# Patient Record
Sex: Female | Born: 1990 | Race: Black or African American | Hispanic: No | Marital: Single | Smoking: Current every day smoker
Health system: Southern US, Community
[De-identification: ages and names within clinical notes are randomized; demographics above are authoritative.]

## PROBLEM LIST (undated history)

## (undated) ENCOUNTER — Inpatient Hospital Stay (HOSPITAL_COMMUNITY): Payer: Self-pay

## (undated) DIAGNOSIS — O1490 Unspecified pre-eclampsia, unspecified trimester: Secondary | ICD-10-CM

## (undated) DIAGNOSIS — L309 Dermatitis, unspecified: Secondary | ICD-10-CM

## (undated) DIAGNOSIS — J45909 Unspecified asthma, uncomplicated: Secondary | ICD-10-CM

## (undated) HISTORY — PX: LYMPH NODE BIOPSY: SHX201

## (undated) HISTORY — PX: DILATION AND CURETTAGE OF UTERUS: SHX78

---

## 2012-08-14 ENCOUNTER — Encounter (HOSPITAL_COMMUNITY): Payer: Self-pay | Admitting: Emergency Medicine

## 2012-08-14 ENCOUNTER — Emergency Department (HOSPITAL_COMMUNITY)
Admission: EM | Admit: 2012-08-14 | Discharge: 2012-08-14 | Disposition: A | Discharge status: Home / Self Care | Payer: Private Health Insurance - Indemnity | Attending: Emergency Medicine | Admitting: Emergency Medicine

## 2012-08-14 DIAGNOSIS — G478 Other sleep disorders: Secondary | ICD-10-CM | POA: Insufficient documentation

## 2012-08-14 DIAGNOSIS — Z8742 Personal history of other diseases of the female genital tract: Secondary | ICD-10-CM | POA: Insufficient documentation

## 2012-08-14 DIAGNOSIS — R51 Headache: Secondary | ICD-10-CM | POA: Insufficient documentation

## 2012-08-14 DIAGNOSIS — F17200 Nicotine dependence, unspecified, uncomplicated: Secondary | ICD-10-CM | POA: Insufficient documentation

## 2012-08-14 DIAGNOSIS — L299 Pruritus, unspecified: Secondary | ICD-10-CM | POA: Insufficient documentation

## 2012-08-14 DIAGNOSIS — M2550 Pain in unspecified joint: Secondary | ICD-10-CM | POA: Insufficient documentation

## 2012-08-14 DIAGNOSIS — L309 Dermatitis, unspecified: Secondary | ICD-10-CM | POA: Insufficient documentation

## 2012-08-14 DIAGNOSIS — O1490 Unspecified pre-eclampsia, unspecified trimester: Secondary | ICD-10-CM | POA: Insufficient documentation

## 2012-08-14 DIAGNOSIS — L259 Unspecified contact dermatitis, unspecified cause: Secondary | ICD-10-CM | POA: Insufficient documentation

## 2012-08-14 HISTORY — DX: Dermatitis, unspecified: L30.9

## 2012-08-14 HISTORY — DX: Unspecified pre-eclampsia, unspecified trimester: O14.90

## 2012-08-14 MED ORDER — OXYCODONE-ACETAMINOPHEN 5-325 MG PO TABS
1.0000 | ORAL_TABLET | Freq: Once | ORAL | Status: AC
  Administered 2012-08-14: 1 via ORAL
  Filled 2012-08-14: qty 1

## 2012-08-14 MED ORDER — CYCLOBENZAPRINE HCL 10 MG PO TABS
5.0000 mg | ORAL_TABLET | Freq: Once | ORAL | Status: AC
  Administered 2012-08-14: 5 mg via ORAL
  Filled 2012-08-14: qty 1

## 2012-08-14 MED ORDER — HYDROCODONE-ACETAMINOPHEN 5-325 MG PO TABS: 1.0000 | ORAL_TABLET | Freq: Four times a day (QID) | ORAL | Status: DC | PRN

## 2012-08-14 MED ORDER — PREDNISONE 10 MG PO TABS: 20.0000 mg | ORAL_TABLET | Freq: Every day | ORAL | Status: DC

## 2012-08-14 MED ORDER — DOXYCYCLINE HYCLATE 100 MG PO CAPS: 100.0000 mg | ORAL_CAPSULE | Freq: Two times a day (BID) | ORAL | Status: DC

## 2012-08-14 MED ORDER — DIPHENHYDRAMINE HCL 25 MG PO CAPS
25.0000 mg | ORAL_CAPSULE | Freq: Once | ORAL | Status: AC
  Administered 2012-08-14: 25 mg via ORAL
  Filled 2012-08-14: qty 1

## 2012-08-14 MED ORDER — PREDNISONE 20 MG PO TABS
60.0000 mg | ORAL_TABLET | Freq: Once | ORAL | Status: AC
  Administered 2012-08-14: 60 mg via ORAL
  Filled 2012-08-14: qty 3

## 2012-08-14 MED ORDER — DIPHENHYDRAMINE HCL 25 MG PO CAPS: 25.0000 mg | ORAL_CAPSULE | Freq: Four times a day (QID) | ORAL | Status: AC | PRN

## 2012-08-14 NOTE — ED Provider Notes (Signed)
History     CSN: [id number]  Arrival date & time 08/14/12  0138   First MD Initiated Contact with Patient 08/14/12 0155      Chief Complaint  Patient presents with  . Rash  . Migraine    (Consider location/radiation/quality/duration/timing/severity/associated sxs/prior treatment) HPI  21 year old female with hx of eczema presents c/o worsening of her eczematic rash and migraine headache.  Patient reports she has had eczema all throughout her life. For the past month her eczema has worsening which affecting her joints throughout.  She reports gradual onset of joint pain, itchiness, worsening rash.  Joint worsen when she straightening her legs or fingers and improves when she flexes her knees and arms.  She usually have to tense up to help prevent itch and pain.  However pt unable to sleep for the past 2 days due to itchiness.  She has been tensing up so much that she is developing a throbbing headache, which affects both of her temples.  Has had similar headache like this before.  She has tried OTC medications at home without relief.  Denies fever, vision changes, throat swelling, trouble swallowing, cp, sob, n/v/d, abd pain.  No recent sick contact, no tick exposure.    Past Medical History  Diagnosis Date  . Eczema   . Pre-eclampsia     Past Surgical History  Procedure Date  . Lymph node biopsy     History reviewed. No pertinent family history.  History  Substance Use Topics  . Smoking status: Current Every Day Smoker -- 0.5 packs/day  . Smokeless tobacco: Not on file  . Alcohol Use: Yes    OB History    Grav Para Term Preterm Abortions TAB SAB Ect Mult Living                  Review of Systems  Constitutional: Negative for fever and appetite change.  Musculoskeletal: Positive for joint swelling.  Skin: Positive for rash. Negative for wound.    Allergies  Other  Home Medications   Current Outpatient Rx  Name  Route  Sig  Dispense  Refill  . OVER THE  COUNTER MEDICATION      Various OTC creams for rash           BP 143/80  Pulse 133  Temp 98.5 F (36.9 C) (Oral)  Resp 20  SpO2 98%  Physical Exam  Nursing note and vitals reviewed. Constitutional: She is oriented to person, place, and time. She appears well-developed and well-nourished. No distress.  HENT:  Head: Normocephalic and atraumatic.  Mouth/Throat: Oropharynx is clear and moist.  Eyes: Conjunctivae normal and EOM are normal. Pupils are equal, round, and reactive to light.  Neck: Neck supple.  Cardiovascular: Normal rate and regular rhythm.   Pulmonary/Chest: Effort normal and breath sounds normal. No respiratory distress. She has no wheezes.  Abdominal: Soft. There is no tenderness.  Musculoskeletal: Normal range of motion. She exhibits tenderness (tenderness to all  major joints (wrists, elbows, knees) on palpation but with FROM, and no edema.  ).  Lymphadenopathy:    She has no cervical adenopathy.  Neurological: She is alert and oriented to person, place, and time. She has normal strength. GCS eye subscore is 4. GCS verbal subscore is 5. GCS motor subscore is 6.  Skin: Rash (pt has significant eczematic rash throughout body monst prominent to neck, flexor aspect of arms and legs, throughout back and abdomen, with excoration marks.  No pustular, petechial, or  vesicular lesions) noted.    ED Course  Procedures (including critical care time)  Labs Reviewed - No data to display No results found.   No diagnosis found.  1. Eczema 2. headache  MDM  Pt with significant eczematous rash throughout body which will need further management outpatient.  No red flags rash noted.    Also c/o headache.  Has no nuchal rigidity and no focal neuro deficits.  Pain medication given.  Pt will be prescribed with prednisone, benadryl, doxycycline and referral resources.  Return precaution given.  Doubt septic arthritis, doubt infectious rash such as RMSF or Lyme.  Care discussed  with my attending.   BP 110/45  Pulse 111  Temp 98.5 F (36.9 C) (Oral)  Resp 18  SpO2 96%            Fayrene Helper, PA-C 08/14/12 [phone]

## 2012-08-14 NOTE — ED Notes (Signed)
Pt has been c/o eczema flare-up x 1 month, but it has become unbearable this week.  Pt in great pain.

## 2012-08-14 NOTE — ED Notes (Addendum)
Patient complaining of exacerbation of her eczema.  Patient states that it breaks out seasonally, but she has not had a break out this bad since she was a kid; patient complaining of pain all over her body.  Patient reports that the rash and pain is causing her to have migraines.  Patient states that she is unable to lay down due to severe back pain.

## 2012-08-14 NOTE — ED Notes (Signed)
Pt resting.   States improvement in pruritis since meds given.

## 2012-08-14 NOTE — ED Provider Notes (Signed)
Medical screening examination/treatment/procedure(s) were performed by non-physician practitioner and as supervising physician I was immediately available for consultation/collaboration.  Olivia Mackie, MD 08/14/12 [phone]

## 2012-10-19 ENCOUNTER — Emergency Department (HOSPITAL_COMMUNITY)
Admission: EM | Admit: 2012-10-19 | Discharge: 2012-10-19 | Disposition: A | Discharge status: Home / Self Care | Payer: Private Health Insurance - Indemnity | Attending: Emergency Medicine | Admitting: Emergency Medicine

## 2012-10-19 ENCOUNTER — Encounter (HOSPITAL_COMMUNITY): Payer: Self-pay | Admitting: Emergency Medicine

## 2012-10-19 DIAGNOSIS — Z3202 Encounter for pregnancy test, result negative: Secondary | ICD-10-CM | POA: Insufficient documentation

## 2012-10-19 DIAGNOSIS — N390 Urinary tract infection, site not specified: Secondary | ICD-10-CM | POA: Insufficient documentation

## 2012-10-19 DIAGNOSIS — D2330 Other benign neoplasm of skin of unspecified part of face: Secondary | ICD-10-CM | POA: Insufficient documentation

## 2012-10-19 DIAGNOSIS — R87619 Unspecified abnormal cytological findings in specimens from cervix uteri: Secondary | ICD-10-CM | POA: Insufficient documentation

## 2012-10-19 DIAGNOSIS — L309 Dermatitis, unspecified: Secondary | ICD-10-CM

## 2012-10-19 DIAGNOSIS — N760 Acute vaginitis: Secondary | ICD-10-CM

## 2012-10-19 DIAGNOSIS — D229 Melanocytic nevi, unspecified: Secondary | ICD-10-CM

## 2012-10-19 DIAGNOSIS — Z79899 Other long term (current) drug therapy: Secondary | ICD-10-CM | POA: Insufficient documentation

## 2012-10-19 DIAGNOSIS — F17200 Nicotine dependence, unspecified, uncomplicated: Secondary | ICD-10-CM | POA: Insufficient documentation

## 2012-10-19 DIAGNOSIS — L299 Pruritus, unspecified: Secondary | ICD-10-CM | POA: Insufficient documentation

## 2012-10-19 DIAGNOSIS — L259 Unspecified contact dermatitis, unspecified cause: Secondary | ICD-10-CM | POA: Insufficient documentation

## 2012-10-19 LAB — URINE MICROSCOPIC-ADD ON

## 2012-10-19 LAB — URINALYSIS, ROUTINE W REFLEX MICROSCOPIC
Glucose, UA: NEGATIVE mg/dL
Ketones, ur: NEGATIVE mg/dL
Leukocytes, UA: NEGATIVE
Nitrite: POSITIVE — AB
Protein, ur: NEGATIVE mg/dL

## 2012-10-19 LAB — POCT PREGNANCY, URINE: Preg Test, Ur: NEGATIVE

## 2012-10-19 MED ORDER — CEPHALEXIN 500 MG PO CAPS: 500.0000 mg | ORAL_CAPSULE | Freq: Four times a day (QID) | ORAL | Status: DC

## 2012-10-19 MED ORDER — HYDROCORTISONE 2.5 % EX LOTN: TOPICAL_LOTION | Freq: Two times a day (BID) | CUTANEOUS | Status: DC

## 2012-10-19 MED ORDER — METRONIDAZOLE 500 MG PO TABS: 500.0000 mg | ORAL_TABLET | Freq: Two times a day (BID) | ORAL | Status: DC

## 2012-10-19 MED ORDER — CEPHALEXIN 500 MG PO CAPS: 500.0000 mg | ORAL_CAPSULE | Freq: Two times a day (BID) | ORAL | Status: DC

## 2012-10-19 NOTE — ED Provider Notes (Signed)
History     CSN: [id number]  Arrival date & time 10/19/12  1052   First MD Initiated Contact with Patient 10/19/12 1128      Chief Complaint  Patient presents with  . Vaginal Discharge    (Consider location/radiation/quality/duration/timing/severity/associated sxs/prior treatment) HPI 22 year old female presents emergency department with chief complaint of eczema flare, right inner thigh mole, and vaginal discharge with odor.  Patient states that she has a long-term history of chronic generalized eczema.  She's been treating herself with over-the-counter medications.  Recently her itching and eczema has become worse D2 increased stress.  Patient states that she has been unable to find a dermatologist who accepts her type of insurance.  Patient also has a mole  on the inner right thigh that developed during her last pregnancy.  It has increased in size since that time and recently began changing morphology as well.  She is concerned that she may have developing cancer.  Patient has also noticed vaginal discharge and odor over the last week.  She denies any vaginal is itching or dyspareunia.  She is in a monogamous relationship with one partner.  She does not suspect sexual transmitted infection she denies any pelvic or lower abdominal pain she denies any urinary symptoms.  Patient also states that she had a previous abnormal Pap smear and was supposed to undergo a LEEP procedure.  This is approximately one year ago and she never followed up.  Past Medical History  Diagnosis Date  . Eczema   . Pre-eclampsia     Past Surgical History  Procedure Laterality Date  . Lymph node biopsy      No family history on file.  History  Substance Use Topics  . Smoking status: Current Every Day Smoker -- 0.50 packs/day  . Smokeless tobacco: Not on file  . Alcohol Use: Yes    OB History   Grav Para Term Preterm Abortions TAB SAB Ect Mult Living                  Review of Systems Ten systems  reviewed and are negative for acute change, except as noted in the HPI.   Allergies  Other and Stadol  Home Medications   Current Outpatient Rx  Name  Route  Sig  Dispense  Refill  . diphenhydrAMINE (BENADRYL) 25 mg capsule   Oral   Take 1 capsule (25 mg total) by mouth every 6 (six) hours as needed for itching.   30 capsule   0   . doxycycline (VIBRAMYCIN) 100 MG capsule   Oral   Take 1 capsule (100 mg total) by mouth 2 (two) times daily.   20 capsule   0   . HYDROcodone-acetaminophen (NORCO/VICODIN) 5-325 MG per tablet   Oral   Take 1 tablet by mouth every 6 (six) hours as needed for pain.   15 tablet   0   . OVER THE COUNTER MEDICATION      Various OTC creams for rash         . predniSONE (DELTASONE) 10 MG tablet   Oral   Take 2 tablets (20 mg total) by mouth daily.   15 tablet   0     BP 125/76  Pulse 78  Temp(Src) 98.8 F (37.1 C) (Oral)  SpO2 100%  Physical Exam  Physical Exam  Nursing note and vitals reviewed. Constitutional: She is oriented to person, place, and time. She appears well-developed and well-nourished. No distress.  HENT:  Head: Normocephalic  and atraumatic.  Eyes: Conjunctivae normal and EOM are normal. Pupils are equal, round, and reactive to light. No scleral icterus.  Neck: Normal range of motion.  Cardiovascular: Normal rate, regular rhythm and normal heart sounds.  Exam reveals no gallop and no friction rub.   No murmur heard. Pulmonary/Chest: Effort normal and breath sounds normal. No respiratory distress.  Abdominal: Soft. Bowel sounds are normal. She exhibits no distension and no mass. There is no tenderness. There is no guarding.  Neurological: She is alert and oriented to person, place, and time.  Skin: Diffuse generalized eczematous rash.  There is hyperpigmentation and thickening. Right thigh mole approx 5mm in diameter with well demarcated borders. Pelvic exam: normal external genitalia, vulva, vagina, cervix, uterus and  adnexa, CERVIX: normal appearing cervix without discharge or lesions, exam chaperoned by RN Martie Lee.   ED Course  Procedures (including critical care time)  Labs Reviewed  URINALYSIS, ROUTINE W REFLEX MICROSCOPIC - Abnormal; Notable for the following:    APPearance CLOUDY (*)    Nitrite POSITIVE (*)    All other components within normal limits  URINE MICROSCOPIC-ADD ON - Abnormal; Notable for the following:    Squamous Epithelial / LPF MANY (*)    Bacteria, UA FEW (*)    All other components within normal limits  GC/CHLAMYDIA PROBE AMP  WET PREP, GENITAL  POCT PREGNANCY, URINE   No results found.   1. Eczema   2. BV (bacterial vaginosis)   3. UTI (lower urinary tract infection)   4. Mole of skin       MDM  12:46 PM BP 125/76  Pulse 78  Temp(Src) 98.8 F (37.1 C) (Oral)  SpO2 100% Patient UA appears to be postitve for Urinary tract infection.   1:23 PM Patient with BV. Labs pending. With flagyll and keflex. Referral to [facility]. TCA lotion and derm referral.' Mole of the thigh is not concerning for melanoma. No scaling or crusting consistent with scc, no pearly surface or telngectasia of BCC. Not verrucous appearing. At this time there does not appear to be any evidence of an acute emergency medical condition and the patient appears stable for discharge with appropriate outpatient follow up.Diagnosis was discussed with patient who verbalizes understanding and is agreeable to discharge.       Arthor Captain, PA-C 10/19/12 1906

## 2012-10-19 NOTE — ED Notes (Signed)
Pt c/o of discharge that is thick and white, with foul odor. Pt also eczema and takes otc hydrocortisone and lotion for tx, eczema has been getting progressively worse since December 2013. Pt also has a mole on inner rt thigh that has changed in characteristics and she is concerned.

## 2012-10-19 NOTE — ED Notes (Signed)
States has been tx for excema  Before and now it is worse also has a mole that needs looking at and sge has foul odor  From her vag has a d/c

## 2012-10-20 LAB — GC/CHLAMYDIA PROBE AMP: CT Probe RNA: NEGATIVE

## 2012-11-02 NOTE — ED Provider Notes (Signed)
Medical screening examination/treatment/procedure(s) were performed by non-physician practitioner and as supervising physician I was immediately available for consultation/collaboration.  Marwan T Powers, MD 11/02/12 0707 

## 2013-02-16 ENCOUNTER — Emergency Department (HOSPITAL_COMMUNITY)
Admission: EM | Admit: 2013-02-16 | Discharge: 2013-02-16 | Disposition: A | Discharge status: Home / Self Care | Payer: Private Health Insurance - Indemnity | Attending: Emergency Medicine | Admitting: Emergency Medicine

## 2013-02-16 ENCOUNTER — Encounter (HOSPITAL_COMMUNITY): Payer: Self-pay | Admitting: Emergency Medicine

## 2013-02-16 DIAGNOSIS — R112 Nausea with vomiting, unspecified: Secondary | ICD-10-CM

## 2013-02-16 DIAGNOSIS — R109 Unspecified abdominal pain: Secondary | ICD-10-CM | POA: Insufficient documentation

## 2013-02-16 DIAGNOSIS — F17200 Nicotine dependence, unspecified, uncomplicated: Secondary | ICD-10-CM | POA: Insufficient documentation

## 2013-02-16 DIAGNOSIS — L309 Dermatitis, unspecified: Secondary | ICD-10-CM

## 2013-02-16 DIAGNOSIS — R197 Diarrhea, unspecified: Secondary | ICD-10-CM | POA: Insufficient documentation

## 2013-02-16 DIAGNOSIS — Z3202 Encounter for pregnancy test, result negative: Secondary | ICD-10-CM | POA: Insufficient documentation

## 2013-02-16 DIAGNOSIS — L259 Unspecified contact dermatitis, unspecified cause: Secondary | ICD-10-CM | POA: Insufficient documentation

## 2013-02-16 LAB — CBC WITH DIFFERENTIAL/PLATELET
Basophils Absolute: 0 10*3/uL (ref 0.0–0.1)
Basophils Relative: 0 % (ref 0–1)
HCT: 36.5 % (ref 36.0–46.0)
Lymphocytes Relative: 15 % (ref 12–46)
MCHC: 34 g/dL (ref 30.0–36.0)
Monocytes Absolute: 0.2 10*3/uL (ref 0.1–1.0)
Neutro Abs: 4.3 10*3/uL (ref 1.7–7.7)
Neutrophils Relative %: 76 % (ref 43–77)
Platelets: 166 10*3/uL (ref 150–400)
RDW: 14.8 % (ref 11.5–15.5)
WBC: 5.7 10*3/uL (ref 4.0–10.5)

## 2013-02-16 LAB — URINALYSIS, ROUTINE W REFLEX MICROSCOPIC
Ketones, ur: 15 mg/dL — AB
Leukocytes, UA: NEGATIVE
Nitrite: NEGATIVE
Protein, ur: NEGATIVE mg/dL
pH: 6 (ref 5.0–8.0)

## 2013-02-16 LAB — POCT PREGNANCY, URINE: Preg Test, Ur: NEGATIVE

## 2013-02-16 LAB — BASIC METABOLIC PANEL
CO2: 23 mEq/L (ref 19–32)
Chloride: 103 mEq/L (ref 96–112)
Creatinine, Ser: 0.76 mg/dL (ref 0.50–1.10)
GFR calc Af Amer: >90 mL/min (ref >90)
Sodium: 138 mEq/L (ref 135–145)

## 2013-02-16 MED ORDER — ONDANSETRON HCL 4 MG PO TABS: 4.0000 mg | ORAL_TABLET | Freq: Three times a day (TID) | ORAL | Status: DC | PRN

## 2013-02-16 MED ORDER — CARRINGTON MOISTURE BARRIER EX CREA: TOPICAL_CREAM | CUTANEOUS | Status: DC | PRN

## 2013-02-16 MED ORDER — SODIUM CHLORIDE 0.9 % IV BOLUS (SEPSIS)
1000.0000 mL | Freq: Once | INTRAVENOUS | Status: AC
  Administered 2013-02-16: 1000 mL via INTRAVENOUS

## 2013-02-16 MED ORDER — ONDANSETRON HCL 4 MG/2ML IJ SOLN
4.0000 mg | Freq: Once | INTRAMUSCULAR | Status: AC
  Administered 2013-02-16: 4 mg via INTRAVENOUS
  Filled 2013-02-16: qty 2

## 2013-02-16 NOTE — ED Notes (Signed)
Pt. Stated, I started having vomiting and diarrhea since yesterday and my mid stomach hurts.

## 2013-02-16 NOTE — ED Notes (Signed)
Rob Browning, PA at bedside  

## 2013-02-16 NOTE — ED Provider Notes (Signed)
Medical screening examination/treatment/procedure(s) were performed by non-physician practitioner and as supervising physician I was immediately available for consultation/collaboration. Devoria Albe, MD, Armando Gang   Ward Givens, MD 02/16/13 [phone]

## 2013-02-16 NOTE — ED Notes (Signed)
D/c teaching completed by Nona Dell, RN. Pt ambulatory upon d/c.

## 2013-02-16 NOTE — ED Notes (Signed)
Pt drink 12oz of gingerale prior to leaving, states feeling better.

## 2013-02-16 NOTE — ED Provider Notes (Signed)
History     CSN: [id number]  Arrival date & time 02/16/13  1159   First MD Initiated Contact with Patient 02/16/13 1211      Chief Complaint  Patient presents with  . Emesis  . Diarrhea    (Consider location/radiation/quality/duration/timing/severity/associated sxs/prior treatment) HPI Comments: Patient presents to emergency department with chief complaint of nausea and vomiting. She states that her son is been sick with the same. She states that she first began having symptoms last night. She denies seeing any blood in her vomit or stool. States that nothing has made her symptoms better or worse. She also endorses cramping abdominal pain, which she believes is due to the retching. She denies any fevers. She has not tried anything to alleviate her symptoms.  The history is provided by the patient. No language interpreter was used.    Past Medical History  Diagnosis Date  . Eczema   . Pre-eclampsia     Past Surgical History  Procedure Laterality Date  . Lymph node biopsy      No family history on file.  History  Substance Use Topics  . Smoking status: Current Every Day Smoker -- 0.50 packs/day  . Smokeless tobacco: Not on file  . Alcohol Use: Yes    OB History   Grav Para Term Preterm Abortions TAB SAB Ect Mult Living                  Review of Systems  All other systems reviewed and are negative.    Allergies  Other and Stadol  Home Medications   Current Outpatient Rx  Name  Route  Sig  Dispense  Refill  . diphenhydrAMINE (BENADRYL) 25 mg capsule   Oral   Take 1 capsule (25 mg total) by mouth every 6 (six) hours as needed for itching.   30 capsule   0   . MELATONIN PO   Oral   Take 1 tablet by mouth as needed (muscle relaxant).           BP 121/79  Pulse 100  Temp(Src) 98.5 F (36.9 C) (Oral)  Resp 16  SpO2 98%  LMP 01/20/2013  Physical Exam  Nursing note and vitals reviewed. Constitutional: She is oriented to person, place, and time.  She appears well-developed and well-nourished.  HENT:  Head: Normocephalic and atraumatic.  Eyes: Conjunctivae and EOM are normal. Pupils are equal, round, and reactive to light.  Neck: Normal range of motion. Neck supple.  Cardiovascular: Normal rate and regular rhythm.  Exam reveals no gallop and no friction rub.   No murmur heard. Pulmonary/Chest: Effort normal and breath sounds normal. No respiratory distress. She has no wheezes. She has no rales. She exhibits no tenderness.  Abdominal: Soft. Bowel sounds are normal. She exhibits no distension and no mass. There is no tenderness. There is no rebound and no guarding.  Diffuse crampy abdominal pain, no focal abdominal tenderness  Musculoskeletal: Normal range of motion. She exhibits no edema and no tenderness.  Neurological: She is alert and oriented to person, place, and time.  Skin: Skin is warm and dry.  Psychiatric: She has a normal mood and affect. Her behavior is normal. Judgment and thought content normal.    ED Course  Procedures (including critical care time)  Results for orders placed during the hospital encounter of 02/16/13  CBC WITH DIFFERENTIAL      Result Value Range   WBC 5.7  4.0 - 10.5 K/uL   RBC 4.25  3.87 - 5.11 MIL/uL   Hemoglobin 12.4  12.0 - 15.0 g/dL   HCT 91.4  78.2 - 95.6 %   MCV 85.9  78.0 - 100.0 fL   MCH 29.2  26.0 - 34.0 pg   MCHC 34.0  30.0 - 36.0 g/dL   RDW 21.3  08.6 - 57.8 %   Platelets 166  150 - 400 K/uL   Neutrophils Relative % 76  43 - 77 %   Neutro Abs 4.3  1.7 - 7.7 K/uL   Lymphocytes Relative 15  12 - 46 %   Lymphs Abs 0.9  0.7 - 4.0 K/uL   Monocytes Relative 4  3 - 12 %   Monocytes Absolute 0.2  0.1 - 1.0 K/uL   Eosinophils Relative 5  0 - 5 %   Eosinophils Absolute 0.3  0.0 - 0.7 K/uL   Basophils Relative 0  0 - 1 %   Basophils Absolute 0.0  0.0 - 0.1 K/uL  BASIC METABOLIC PANEL      Result Value Range   Sodium 138  135 - 145 mEq/L   Potassium 3.7  3.5 - 5.1 mEq/L   Chloride  103  96 - 112 mEq/L   CO2 23  19 - 32 mEq/L   Glucose, Bld 74  70 - 99 mg/dL   BUN 13  6 - 23 mg/dL   Creatinine, Ser 4.69  0.50 - 1.10 mg/dL   Calcium 9.0  8.4 - 62.9 mg/dL   GFR calc non Af Amer >90  >90 mL/min   GFR calc Af Amer >90  >90 mL/min  URINALYSIS, ROUTINE W REFLEX MICROSCOPIC      Result Value Range   Color, Urine YELLOW  YELLOW   APPearance CLOUDY (*) CLEAR   Specific Gravity, Urine 1.029  1.005 - 1.030   pH 6.0  5.0 - 8.0   Glucose, UA NEGATIVE  NEGATIVE mg/dL   Hgb urine dipstick NEGATIVE  NEGATIVE   Bilirubin Urine SMALL (*) NEGATIVE   Ketones, ur 15 (*) NEGATIVE mg/dL   Protein, ur NEGATIVE  NEGATIVE mg/dL   Urobilinogen, UA 1.0  0.0 - 1.0 mg/dL   Nitrite NEGATIVE  NEGATIVE   Leukocytes, UA NEGATIVE  NEGATIVE  POCT PREGNANCY, URINE      Result Value Range   Preg Test, Ur NEGATIVE  NEGATIVE   No results found.    1. Nausea vomiting and diarrhea   2. Eczema       MDM  Patient with nausea, vomiting, and diarrhea. Suspect viral gastroenteritis. Patient's son has been sick with the same. Will treat with Zofran, fluids, check basic labs.  2:19 PM Patient is feeling better after zofran and fluids.  Will discharge to home with zofran.  Abdomen is non-acute.  No focal abdominal tenderness.  Tolerating PO fluids.  Will discharge to home with PCP follow-up.  Patient understands and agrees with the plan.        Roxy Horseman, PA-C 02/16/13 1420

## 2013-02-16 NOTE — ED Notes (Signed)
Pt given ginger ale in effort to fluid challenge pt

## 2013-02-19 ENCOUNTER — Encounter (HOSPITAL_COMMUNITY): Payer: Self-pay | Admitting: Emergency Medicine

## 2013-02-19 ENCOUNTER — Emergency Department (INDEPENDENT_AMBULATORY_CARE_PROVIDER_SITE_OTHER)
Admission: EM | Admit: 2013-02-19 | Discharge: 2013-02-19 | Disposition: A | Discharge status: Home / Self Care | Payer: Private Health Insurance - Indemnity | Source: Home / Self Care

## 2013-02-19 DIAGNOSIS — L259 Unspecified contact dermatitis, unspecified cause: Secondary | ICD-10-CM

## 2013-02-19 DIAGNOSIS — L309 Dermatitis, unspecified: Secondary | ICD-10-CM

## 2013-02-19 MED ORDER — TRIAMCINOLONE ACETONIDE 40 MG/ML IJ SUSP
40.0000 mg | Freq: Once | INTRAMUSCULAR | Status: AC
  Administered 2013-02-19: 40 mg via INTRAMUSCULAR

## 2013-02-19 MED ORDER — TRIAMCINOLONE ACETONIDE 40 MG/ML IJ SUSP
INTRAMUSCULAR | Status: AC
  Filled 2013-02-19: qty 5

## 2013-02-19 MED ORDER — TRIAMCINOLONE ACETONIDE 0.1 % EX CREA: TOPICAL_CREAM | Freq: Two times a day (BID) | CUTANEOUS | Status: DC

## 2013-02-19 MED ORDER — METHYLPREDNISOLONE 4 MG PO KIT: PACK | ORAL | Status: DC

## 2013-02-19 NOTE — ED Provider Notes (Signed)
Medical screening examination/treatment/procedure(s) were performed by non-physician practitioner and as supervising physician I was immediately available for consultation/collaboration.  Leslee Home, M.D.   Reuben Likes, MD 02/19/13 2101

## 2013-02-19 NOTE — ED Provider Notes (Signed)
History     CSN: [id number]  Arrival date & time 02/19/13  1742   None     No chief complaint on file.   (Consider location/radiation/quality/duration/timing/severity/associated sxs/prior treatment) HPI Comments: Patient has a history of eczema since childhood. M.D. 0 2013 she developed an eczema flare which has persisted through this year as well. He makes it worse. She has a rough papular rash covering most body surface areas. It is very pruritic. She has been trying OTC medications with minimal to no relief.   Past Medical History  Diagnosis Date  . Eczema   . Pre-eclampsia     Past Surgical History  Procedure Laterality Date  . Lymph node biopsy      No family history on file.  History  Substance Use Topics  . Smoking status: Current Every Day Smoker -- 0.50 packs/day  . Smokeless tobacco: Not on file  . Alcohol Use: Yes    OB History   Grav Para Term Preterm Abortions TAB SAB Ect Mult Living                  Review of Systems  Skin: Positive for rash.       As per history of present illness  All other systems reviewed and are negative.    Allergies  Other and Stadol  Home Medications   Current Outpatient Rx  Name  Route  Sig  Dispense  Refill  . diphenhydrAMINE (BENADRYL) 25 mg capsule   Oral   Take 1 capsule (25 mg total) by mouth every 6 (six) hours as needed for itching.   30 capsule   0   . MELATONIN PO   Oral   Take 1 tablet by mouth as needed (muscle relaxant).         . methylPREDNISolone (MEDROL DOSEPAK) 4 MG tablet      follow package directions. Start 02/20/2013   21 tablet   0   . ondansetron (ZOFRAN) 4 MG tablet   Oral   Take 1 tablet (4 mg total) by mouth every 8 (eight) hours as needed for nausea.   10 tablet   0   . Skin Protectants, Misc. (EUCERIN) cream   Topical   Apply topically as needed for wound care.   397 g   0   . triamcinolone cream (KENALOG) 0.1 %   Topical   Apply topically 2 (two) times daily.  Apply for 2 weeks. May use on face   30 g   0     LMP 01/20/2013  Physical Exam  Nursing note and vitals reviewed. Constitutional: She is oriented to person, place, and time. She appears well-nourished. No distress.  Neck: Normal range of motion. Neck supple.  Cardiovascular: Normal rate.   Pulmonary/Chest: Effort normal. No respiratory distress.  Musculoskeletal: She exhibits no edema and no tenderness.  Neurological: She is alert and oriented to person, place, and time. She exhibits normal muscle tone.  Skin: Skin is warm and dry. Rash noted.  Rash as described above.    ED Course  Procedures (including critical care time)  Labs Reviewed - No data to display No results found.   1. Chronic eczema       MDM  Triamcinolone cream to use as directed. Kenalog 40 mg IM Medrol Dosepak for 7 days. Called the above number to obtain a primary care doctor.        Hayden Rasmussen, NP 02/19/13 1816

## 2013-02-19 NOTE — ED Notes (Signed)
Pt c/o eczema flare up x6 months all over body... Denies: fevers... She is alert and oriented w/no signs of acute distress.

## 2013-03-17 ENCOUNTER — Emergency Department (HOSPITAL_COMMUNITY): Payer: Private Health Insurance - Indemnity

## 2013-03-17 ENCOUNTER — Emergency Department (HOSPITAL_COMMUNITY)
Admission: EM | Admit: 2013-03-17 | Discharge: 2013-03-17 | Disposition: A | Discharge status: Home / Self Care | Payer: Private Health Insurance - Indemnity | Attending: Emergency Medicine | Admitting: Emergency Medicine

## 2013-03-17 ENCOUNTER — Encounter (HOSPITAL_COMMUNITY): Payer: Self-pay | Admitting: Emergency Medicine

## 2013-03-17 DIAGNOSIS — O99330 Smoking (tobacco) complicating pregnancy, unspecified trimester: Secondary | ICD-10-CM | POA: Insufficient documentation

## 2013-03-17 DIAGNOSIS — O210 Mild hyperemesis gravidarum: Secondary | ICD-10-CM | POA: Insufficient documentation

## 2013-03-17 DIAGNOSIS — N949 Unspecified condition associated with female genital organs and menstrual cycle: Secondary | ICD-10-CM | POA: Insufficient documentation

## 2013-03-17 DIAGNOSIS — R109 Unspecified abdominal pain: Secondary | ICD-10-CM | POA: Insufficient documentation

## 2013-03-17 DIAGNOSIS — O039 Complete or unspecified spontaneous abortion without complication: Secondary | ICD-10-CM

## 2013-03-17 DIAGNOSIS — O9989 Other specified diseases and conditions complicating pregnancy, childbirth and the puerperium: Secondary | ICD-10-CM | POA: Insufficient documentation

## 2013-03-17 DIAGNOSIS — R51 Headache: Secondary | ICD-10-CM | POA: Insufficient documentation

## 2013-03-17 DIAGNOSIS — M545 Low back pain, unspecified: Secondary | ICD-10-CM | POA: Insufficient documentation

## 2013-03-17 DIAGNOSIS — O209 Hemorrhage in early pregnancy, unspecified: Secondary | ICD-10-CM | POA: Insufficient documentation

## 2013-03-17 DIAGNOSIS — Z872 Personal history of diseases of the skin and subcutaneous tissue: Secondary | ICD-10-CM | POA: Insufficient documentation

## 2013-03-17 LAB — CBC WITH DIFFERENTIAL/PLATELET
Basophils Relative: 0 % (ref 0–1)
Hemoglobin: 12.4 g/dL (ref 12.0–15.0)
Lymphs Abs: 1.4 10*3/uL (ref 0.7–4.0)
MCHC: 34.3 g/dL (ref 30.0–36.0)
Monocytes Relative: 5 % (ref 3–12)
Neutro Abs: 4.2 10*3/uL (ref 1.7–7.7)
Neutrophils Relative %: 67 % (ref 43–77)
Platelets: 190 10*3/uL (ref 150–400)
RBC: 4.18 MIL/uL (ref 3.87–5.11)

## 2013-03-17 LAB — WET PREP, GENITAL
Clue Cells Wet Prep HPF POC: NONE SEEN
Trich, Wet Prep: NONE SEEN
Yeast Wet Prep HPF POC: NONE SEEN

## 2013-03-17 LAB — ABO/RH: ABO/RH(D): B POS

## 2013-03-17 LAB — BASIC METABOLIC PANEL
Calcium: 9.3 mg/dL (ref 8.4–10.5)
GFR calc Af Amer: >90 mL/min (ref >90)
GFR calc non Af Amer: >90 mL/min (ref >90)
Potassium: 3.9 mEq/L (ref 3.5–5.1)
Sodium: 136 mEq/L (ref 135–145)

## 2013-03-17 MED ORDER — ONDANSETRON HCL 4 MG PO TABS: 4.0000 mg | ORAL_TABLET | Freq: Four times a day (QID) | ORAL | Status: DC

## 2013-03-17 MED ORDER — NAPROXEN 250 MG PO TABS
500.0000 mg | ORAL_TABLET | Freq: Once | ORAL | Status: AC
  Administered 2013-03-17: 500 mg via ORAL
  Filled 2013-03-17: qty 2

## 2013-03-17 MED ORDER — SODIUM CHLORIDE 0.9 % IV BOLUS (SEPSIS)
1000.0000 mL | Freq: Once | INTRAVENOUS | Status: AC
  Administered 2013-03-17: 1000 mL via INTRAVENOUS

## 2013-03-17 MED ORDER — KETOROLAC TROMETHAMINE 30 MG/ML IJ SOLN
30.0000 mg | Freq: Once | INTRAMUSCULAR | Status: AC
  Administered 2013-03-17: 30 mg via INTRAVENOUS
  Filled 2013-03-17: qty 1

## 2013-03-17 NOTE — ED Provider Notes (Signed)
History    CSN: [id number] Arrival date & time 03/17/13  9147  First MD Initiated Contact with Patient 03/17/13 [phone]     Chief Complaint  Patient presents with  . Vaginal Bleeding   (Consider location/radiation/quality/duration/timing/severity/associated sxs/prior Treatment) HPI  Patient is a 22 year old female she [id number] presenting to the ED for vaginal bleeding w/ associated lower abdominal cramping and low back pain that began last evening. She rates her pain 3/10. Patient states she has not noticed any clotting and has gone through approximately 1 menstrual pad every 3-4 hours. Pt states since coming off the Depo shot one year ago her periods have been irregular. She states she took a pregnancy test last evening and the result was positive. Pt also endorses nausea, vomiting, generalized headache.   Past Medical History  Diagnosis Date  . Eczema   . Pre-eclampsia    Past Surgical History  Procedure Laterality Date  . Lymph node biopsy     No family history on file. History  Substance Use Topics  . Smoking status: Current Every Day Smoker -- 0.50 packs/day  . Smokeless tobacco: Not on file  . Alcohol Use: Yes   OB History   Grav Para Term Preterm Abortions TAB SAB Ect Mult Living                 Review of Systems  Constitutional: Negative for fever and chills.  HENT: Negative for neck pain and neck stiffness.   Eyes: Negative.   Respiratory: Negative for shortness of breath.   Cardiovascular: Negative for chest pain.  Gastrointestinal: Positive for nausea and abdominal pain.  Genitourinary: Positive for vaginal bleeding and pelvic pain.  Musculoskeletal: Positive for back pain.  Skin: Negative.   Neurological: Positive for headaches.    Allergies  Other and Stadol  Home Medications   Current Outpatient Rx  Name  Route  Sig  Dispense  Refill  . Aspirin-Salicylamide-Caffeine (BC HEADACHE PO)   Oral   Take 1 each by mouth daily as needed (pain).          . cetirizine (ZYRTEC) 5 MG tablet   Oral   Take 5 mg by mouth daily as needed for allergies.         . diphenhydrAMINE (BENADRYL) 25 mg capsule   Oral   Take 1 capsule (25 mg total) by mouth every 6 (six) hours as needed for itching.   30 capsule   0   . OVER THE COUNTER MEDICATION   Oral   Take 2 tablets by mouth daily. OTC vitamin for hair         . ondansetron (ZOFRAN) 4 MG tablet   Oral   Take 1 tablet (4 mg total) by mouth every 6 (six) hours.   12 tablet   0    BP 103/66  Pulse 88  Temp(Src) 97.8 F (36.6 C) (Oral)  Resp 16  SpO2 98%  LMP 02/07/2013 Physical Exam  Constitutional: She is oriented to person, place, and time. She appears well-developed and well-nourished. No distress.  HENT:  Head: Normocephalic and atraumatic.  Mouth/Throat: Oropharynx is clear and moist.  Eyes: Conjunctivae are normal.  Neck: Neck supple.  Cardiovascular: Normal rate, regular rhythm and normal heart sounds.   Pulmonary/Chest: Effort normal and breath sounds normal.  Abdominal: Soft. She exhibits no distension. There is tenderness. There is no rebound and no guarding.  Musculoskeletal: Normal range of motion. She exhibits no edema.  Neurological: She is alert and oriented to  person, place, and time.  Skin: Skin is warm and dry. She is not diaphoretic.   Exam performed by Francee Piccolo L,  exam chaperoned Date: 03/17/2013 Pelvic exam: normal external genitalia without evidence of trauma. VULVA: normal appearing vulva with no masses, tenderness or lesion. VAGINA: normal appearing vagina with normal color and discharge, no lesions. CERVIX: normal appearing cervix without lesions, cervical motion tenderness absent, cervical os open with bleeding; vaginal discharge - bloody, Wet prep and DNA probe for chlamydia and GC obtained.   ADNEXA: normal adnexa in size, nontender and no masses UTERUS: uterus is normal size, shape, consistency and nontender.    ED Course   Procedures (including critical care time)  Medications  sodium chloride 0.9 % bolus 1,000 mL (0 mLs Intravenous Stopped 03/17/13 1324)  naproxen (NAPROSYN) tablet 500 mg (500 mg Oral Given 03/17/13 1145)  ketorolac (TORADOL) 30 MG/ML injection 30 mg (30 mg Intravenous Given 03/17/13 1323)    Labs Reviewed  WET PREP, GENITAL - Abnormal; Notable for the following:    WBC, Wet Prep HPF POC MODERATE (*)    All other components within normal limits  HCG, QUANTITATIVE, PREGNANCY - Abnormal; Notable for the following:    hCG, Beta Chain, Quant, S 3307 (*)    All other components within normal limits  POCT PREGNANCY, URINE - Abnormal; Notable for the following:    Preg Test, Ur POSITIVE (*)    All other components within normal limits  GC/CHLAMYDIA PROBE AMP  BASIC METABOLIC PANEL  CBC WITH DIFFERENTIAL  RPR  ABO/RH   US Ob Comp Less 14 Wks  03/17/2013   *RADIOLOGY REPORT*  Clinical Data: Vaginal bleeding.  OBSTETRIC <14 WK [place] AND TRANSVAGINAL OB US  Technique:  Both transabdominal and transvaginal ultrasound examinations were performed for complete evaluation of the gestation as well as the maternal uterus, adnexal regions, and pelvic cul-de-sac.  Transvaginal technique was performed to assess early pregnancy.  Comparison:  None.  Intrauterine gestational sac:  Visualized/normal in shape. Yolk sac: Present Embryo: None Cardiac Activity: None Heart Rate: N/A bpm  MSD: 6.9 mm  5 w 3 d CRL:   mm   w   d             [place] EDC: 11/14/2013  Maternal uterus/adnexae: No subchorionic hemorrhage. Normal right ovary. Corpus luteum cyst on the left. Trace free pelvic fluid.  IMPRESSION:  1.  Intrauterine gestational sac and small yolk sac.  No embryo. 2.  No subchorionic hemorrhage. 3.  Normal ovaries except for a small left sided corpus luteum cyst.   Original Report Authenticated By: Rudie Meyer, M.D.   US Ob Transvaginal  03/17/2013   *RADIOLOGY REPORT*  Clinical Data: Vaginal bleeding.  OBSTETRIC <14 WK [place]  AND TRANSVAGINAL OB US  Technique:  Both transabdominal and transvaginal ultrasound examinations were performed for complete evaluation of the gestation as well as the maternal uterus, adnexal regions, and pelvic cul-de-sac.  Transvaginal technique was performed to assess early pregnancy.  Comparison:  None.  Intrauterine gestational sac:  Visualized/normal in shape. Yolk sac: Present Embryo: None Cardiac Activity: None Heart Rate: N/A bpm  MSD: 6.9 mm  5 w 3 d CRL:   mm   w   d             [place] EDC: 11/14/2013  Maternal uterus/adnexae: No subchorionic hemorrhage. Normal right ovary. Corpus luteum cyst on the left. Trace free pelvic fluid.  IMPRESSION:  1.  Intrauterine gestational sac and  small yolk sac.  No embryo. 2.  No subchorionic hemorrhage. 3.  Normal ovaries except for a small left sided corpus luteum cyst.   Original Report Authenticated By: Rudie Meyer, M.D.   1. Vaginal bleeding before [redacted] weeks gestation   2. Miscarriage     MDM  Patient presenting with vaginal bleeding and positive pregnancy test. Pelvic exam reveals cervical os open and actively bleeding. Patient Rh+ need for RhoGAM. Ultrasound reviewed. No evidence of ectopic pregnancy. Labs reviewed. Pain managed in ED. Patient advised to followup with OB/GYN regarding miscarriage. Patient d/w with Dr. Ranae Palms, agrees with plan. Patient is stable at time of discharge    Jeannetta Ellis, PA-C 03/17/13 1603

## 2013-03-17 NOTE — ED Notes (Signed)
Paged and spoke with IV team for IV access. Attempted twice unable.

## 2013-03-17 NOTE — ED Notes (Addendum)
Pt has been pregnant 8 times and has 4 children. Pt reports she got a positive pregnancy test yesterday while she was having vaginal bleeding and cramping. Bleeding and cramping remains today; has hx of miscarriages and "this sort of feels like that". Soaks a pad in 3-4 hours. Reports she feels dizzy.

## 2013-03-18 LAB — GC/CHLAMYDIA PROBE AMP: GC Probe RNA: NEGATIVE

## 2013-03-18 NOTE — ED Provider Notes (Signed)
Medical screening examination/treatment/procedure(s) were performed by non-physician practitioner and as supervising physician I was immediately available for consultation/collaboration.   Mikelle Myrick, MD 03/18/13 0712 

## 2013-03-30 ENCOUNTER — Encounter (HOSPITAL_COMMUNITY): Payer: Self-pay

## 2013-03-30 ENCOUNTER — Inpatient Hospital Stay (HOSPITAL_COMMUNITY): Payer: Managed Care, Other (non HMO)

## 2013-03-30 ENCOUNTER — Inpatient Hospital Stay (HOSPITAL_COMMUNITY)
Admission: AD | Admit: 2013-03-30 | Discharge: 2013-03-30 | Disposition: A | Discharge status: Home / Self Care | Payer: Managed Care, Other (non HMO) | Source: Ambulatory Visit | Attending: Obstetrics and Gynecology | Admitting: Obstetrics and Gynecology

## 2013-03-30 DIAGNOSIS — O209 Hemorrhage in early pregnancy, unspecified: Secondary | ICD-10-CM

## 2013-03-30 HISTORY — DX: Unspecified asthma, uncomplicated: J45.909

## 2013-03-30 LAB — CBC
Hemoglobin: 12.5 g/dL (ref 12.0–15.0)
MCH: 29.1 pg (ref 26.0–34.0)
MCHC: 33.4 g/dL (ref 30.0–36.0)
MCV: 87 fL (ref 78.0–100.0)
RBC: 4.3 MIL/uL (ref 3.87–5.11)

## 2013-03-30 LAB — HCG, QUANTITATIVE, PREGNANCY: hCG, Beta Chain, Quant, S: 2990 m[IU]/mL — ABNORMAL HIGH (ref <5)

## 2013-03-30 MED ORDER — IBUPROFEN 600 MG PO TABS: 600.0000 mg | ORAL_TABLET | Freq: Four times a day (QID) | ORAL | Status: AC | PRN

## 2013-03-30 NOTE — MAU Note (Signed)
Patient is in with c/o heavy vaginal bleeding. She does not have a pad on at this time. New pad given. She states that she is having lower abdominal pain.

## 2013-03-30 NOTE — MAU Note (Signed)
Patient states she has been bleeding with clots since she was seen at [facility] ED on 7-16. Has been having abdominal cramping. States the ER MD told her her cervix was open and that she was having a miscarriage.

## 2013-03-30 NOTE — MAU Provider Note (Signed)
  History     CSN: [id number]  Arrival date and time: 03/30/13 1339   None     Chief Complaint  Patient presents with  . Vaginal Bleeding  . Abdominal Cramping   HPI  Pt is [redacted]w[redacted]d pregnant and presents with bleeding in pregnancy.  Pt was seen on 03/17/2013 at [facility] whti bleeding and cramping in pregnancy.  Pt's HCG was 3307 and ultrasound showed [redacted]w[redacted]d IUGS with YS and sm left CLC and no SCH.  Pt was told her cervix was open and she was having a mscarriage and told she could take Motrin or Ibuprofen for pain. Pt states she had continued to have bleeding since she was seen. She has had bleeding a pad an hour.  Pt has hx of SAB x4 - previous pregnancies.  Pt had 4 term deliveries prior to her SABs.  Pt states she has not had this long of bleeding with her other SABs.   RN note: Patient states she has been bleeding with clots since she was seen at [facility] ED on 7-16. Has been having abdominal cramping. States the ER MD told her her cervix was open and that she was having a miscarriage.   Past Medical History  Diagnosis Date  . Eczema   . Pre-eclampsia     Past Surgical History  Procedure Laterality Date  . Lymph node biopsy      No family history on file.  History  Substance Use Topics  . Smoking status: Current Every Day Smoker -- 0.50 packs/day  . Smokeless tobacco: Not on file  . Alcohol Use: Yes    Allergies:  Allergies  Allergen Reactions  . Other     Pain medication given during labor and delivery; unsure of name.  . Stadol (Butorphanol) Rash    Prescriptions prior to admission  Medication Sig Dispense Refill  . Aspirin-Salicylamide-Caffeine (BC HEADACHE PO) Take 1 each by mouth daily as needed (pain).      . cetirizine (ZYRTEC) 5 MG tablet Take 5 mg by mouth daily as needed for allergies.      . diphenhydrAMINE (BENADRYL) 25 mg capsule Take 1 capsule (25 mg total) by mouth every 6 (six) hours as needed for itching.  30 capsule  0  . ondansetron (ZOFRAN) 4 MG tablet Take 1  tablet (4 mg total) by mouth every 6 (six) hours.  12 tablet  0  . OVER THE COUNTER MEDICATION Take 2 tablets by mouth daily. OTC vitamin for hair        ROS Physical Exam   Blood pressure 121/77, pulse 111, resp. rate 16, height 5' 0.5" (1.537 m), weight 58.06 kg (128 lb), last menstrual period 02/07/2013, SpO2 100.00%.  Physical Exam  MAU Course  Procedures Care turned over to Deeann Cree, CNM    Assessment and Plan    Diana Dodson 03/30/2013, 1:55 PM

## 2013-03-30 NOTE — MAU Provider Note (Signed)
Assumed care from Pamelia Hoit NP at 1710. SUBJECTIVE:  Diana Dodson 22 y.o. [phone] @[redacted]w[redacted]d  With early pregnancy bleeding his quantitative beta hCG has fallen from 3307 on 03/17/2013 to 2990 today, 11 days later.Ultrasound on 03/17/2013 showed 5 week 3 day intrauterine gestational sac with yolk sac and small CLC. She has continued bleeding since the 16th, passing clots and having abdominal cramping. History is significant for 4 prior SABs.Marland Kitchen   GC/CT, WP were neg. B pos  OBJECTIVE: Filed Vitals:   03/30/13 1349  BP: 121/77  Pulse: 111  Resp: 16   Results for orders placed during the hospital encounter of 03/30/13 (from the past 24 hour(s))  CBC     Status: None   Collection Time    03/30/13  1:54 PM      Result Value Range   WBC 9.0  4.0 - 10.5 K/uL   RBC 4.30  3.87 - 5.11 MIL/uL   Hemoglobin 12.5  12.0 - 15.0 g/dL   HCT 45.4  09.8 - 11.9 %   MCV 87.0  78.0 - 100.0 fL   MCH 29.1  26.0 - 34.0 pg   MCHC 33.4  30.0 - 36.0 g/dL   RDW 14.7  82.9 - 56.2 %   Platelets 171  150 - 400 K/uL  HCG, QUANTITATIVE, PREGNANCY     Status: Abnormal   Collection Time    03/30/13  1:54 PM      Result Value Range   hCG, Beta Chain, Quant, S 2990 (*) <5 mIU/mL   RADIOLOGY REPORT*  Clinical Data: Vaginal bleeding, recent prior exam demonstrating  intrauterine gestational sac and yolk sac  TRANSVAGINAL OBSTETRIC US  Technique: Transvaginal ultrasound was performed for complete  evaluation of the gestation as well as the maternal uterus, adnexal  regions, and pelvic cul-de-sac.  Comparison: 03/17/2013  Intrauterine gestational [id number], mildly irregular in shape  Yolk sac: Visualized  Embryo: Visualized  Cardiac Activity: Not visualized  CRL: 3mm 6 w 0 d [place] EDC: 11/23/13  Maternal uterus/adnexae:  The ovaries normal. Trace free fluid.  IMPRESSION:  Interval development of a fetal pole without cardiac activity yet  identified within an intrauterine gestational sac. Findings are   suspicious for, but not conclusive for, failed intrauterine  pregnancy. Follow-up ultrasound is recommended in 7 days for  confirmation. This recommendation follows SRU consensus  guidelines: Diagnostic Criteria for Nonviable Pregnancy Early in  the First Trimester. Malva Limes Med 2013; 952:8413-24.  Original Report Authenticated By: Christiana Pellant, M.D.      ASSESSMENT: 1. Bleeding in early pregnancy   Probable failed IUP PLAN: Home with bleeding precautions D/W Dr. Jolayne Panther who reviewed the serial beta hCGs from different labs and the two ultrasound: advises that she return in 2 days for followup beta hCG. If  rising appropriately we'll do an ultrasound in one week. If dropping we will offer her Cytotec and patient understands this option.   Medication List         diphenhydrAMINE 25 mg capsule  Commonly known as:  BENADRYL  Take 1 capsule (25 mg total) by mouth every 6 (six) hours as needed for itching.     ibuprofen 600 MG tablet  Commonly known as:  ADVIL,MOTRIN  Take 1 tablet (600 mg total) by mouth every 6 (six) hours as needed for pain.     ibuprofen 200 MG tablet  Commonly known as:  ADVIL,MOTRIN  Take 800 mg by mouth every 6 (six) hours as needed for pain.  OVER THE COUNTER MEDICATION  Take 2 tablets by mouth daily. OTC vitamin for hair       Follow-up Information   Follow up with [facility] [place] [facility]ERN[facility] ADMISSIONS. (Repeat blood test)    Contact information:   [address] [id number] [place] [address] [phone]    Danae Orleans, CNM 03/30/2013 6:53 PM

## 2013-04-03 ENCOUNTER — Inpatient Hospital Stay (HOSPITAL_COMMUNITY)
Admission: AD | Admit: 2013-04-03 | Discharge: 2013-04-03 | Disposition: A | Discharge status: Home / Self Care | Payer: Managed Care, Other (non HMO) | Source: Ambulatory Visit | Attending: Obstetrics & Gynecology | Admitting: Obstetrics & Gynecology

## 2013-04-03 DIAGNOSIS — O034 Incomplete spontaneous abortion without complication: Secondary | ICD-10-CM

## 2013-04-03 DIAGNOSIS — O021 Missed abortion: Secondary | ICD-10-CM | POA: Insufficient documentation

## 2013-04-03 MED ORDER — OXYCODONE-ACETAMINOPHEN 5-325 MG PO TABS: 1.0000 | ORAL_TABLET | ORAL | Status: DC

## 2013-04-03 MED ORDER — MISOPROSTOL 200 MCG PO TABS: ORAL_TABLET | ORAL | Status: AC

## 2013-04-03 MED ORDER — HYDROCODONE-ACETAMINOPHEN 5-325 MG PO TABS: 1.0000 | ORAL_TABLET | ORAL | Status: AC | PRN

## 2013-04-03 NOTE — MAU Provider Note (Signed)
Angellynn Kimberlin is a 22 y.o. female [phone] @[redacted]w[redacted]d  LMP here for repeat quant.  SUBJECTIVE: Had EPB and seen at [facility] 03/17/2013 with quantitative beta hCG 3307; next seen here 11 days later on 03/30/2013 with quantitative beta hCG 2990. Ultrasound that day showed interval development of a fetal pole with no cardiac activity and CRL consistent with 6 weeks 0 day gestation. Plan was to return for another quant and if rising appropriately schedule ultrasound in one week, if dropping offer Cytotec.  OBJECTIVE: Filed Vitals:   04/03/13 1602  BP: 133/81  Pulse: 115  Temp: 98 F (36.7 C)  Resp: 18   Results for orders placed during the hospital encounter of 04/03/13 (from the past 24 hour(s))  HCG, QUANTITATIVE, PREGNANCY     Status: Abnormal   Collection Time    04/03/13  4:11 PM      Result Value Range   hCG, Beta Chain, Quant, S 821 (*) <5 mIU/mL   ASSESSMENT: Failed early pregnancy  PLAN: cytotec  Care seen by Sharen Counter CNM at 1700. Danae Orleans, CNM 04/03/2013 5:09 PM  Offered Cytotec, pt planning 2 hour drive out of town today so Cytotec 800 mcg prescribed.  Discussed bleeding risks. Pt will be with family and near medical facilities.  Percocet 5/325 x10 tabs prescribed. Continue ibuprofen 600-800 mg Q6 hours F/U in clinic in 1 week for quant hcg.  Message sent to clinic. Return to MAU as needed  Sharen Counter Certified Nurse-Midwife

## 2013-04-03 NOTE — MAU Note (Signed)
Pt reports bleeding and cramping are worse today.Stated she is having a lot of clots come out and changing her pad frequently.

## 2013-04-05 NOTE — MAU Provider Note (Signed)
Attestation of Attending Supervision of Advanced Practitioner (CNM/NP): Evaluation and management procedures were performed by the Advanced Practitioner under my supervision and collaboration.  I have reviewed the Advanced Practitioner's note and chart, and I agree with the management and plan.  Trinka Keshishyan 04/05/2013 2:21 PM

## 2013-04-05 NOTE — MAU Provider Note (Signed)
Attestation of Attending Supervision of Advanced Practitioner (CNM/NP): Evaluation and management procedures were performed by the Advanced Practitioner under my supervision and collaboration.  I have reviewed the Advanced Practitioner's note and chart, and I agree with the management and plan.  Darah Simkin 04/05/2013 2:20 PM

## 2013-04-12 ENCOUNTER — Other Ambulatory Visit: Payer: Private Health Insurance - Indemnity

## 2014-02-02 ENCOUNTER — Encounter (HOSPITAL_COMMUNITY): Payer: Self-pay | Admitting: *Deleted

## 2014-04-09 IMAGING — US US OB COMP LESS 14 WK
1 series · 14 of 28 positions shown · non-contrast
Comparison: None.

CLINICAL DATA: Vaginal bleeding.

OBSTETRIC <14 WK US AND TRANSVAGINAL OB US
TECHNIQUE: Both transabdominal and transvaginal ultrasound
examinations were performed for complete evaluation of the
gestation as well as the maternal uterus, adnexal regions, and
pelvic cul-de-sac.  Transvaginal technique was performed to assess
early pregnancy.

[Series 1: us ob comp less 14 wk · 0.30mm/px · 14 of 50 slices shown]
[im 2/50]
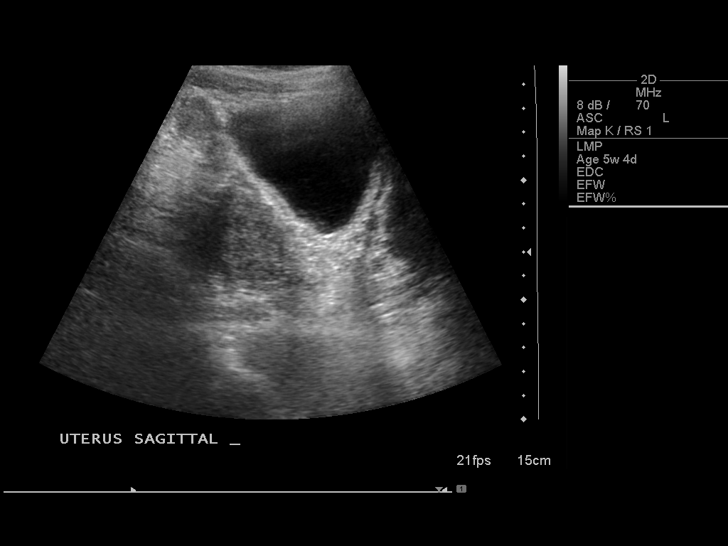
[im 6/50]
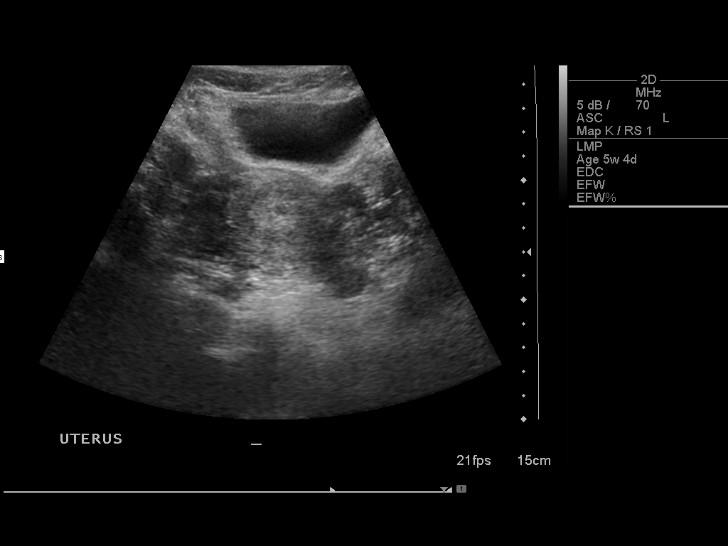
[im 10/50]
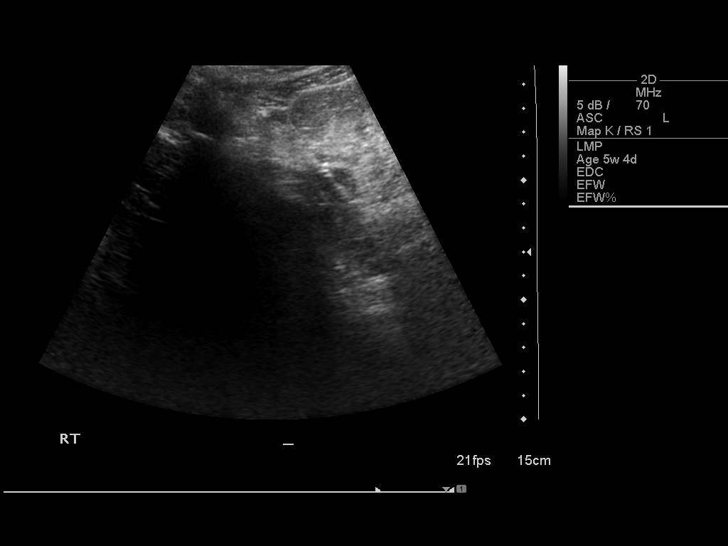
[im 13/50]
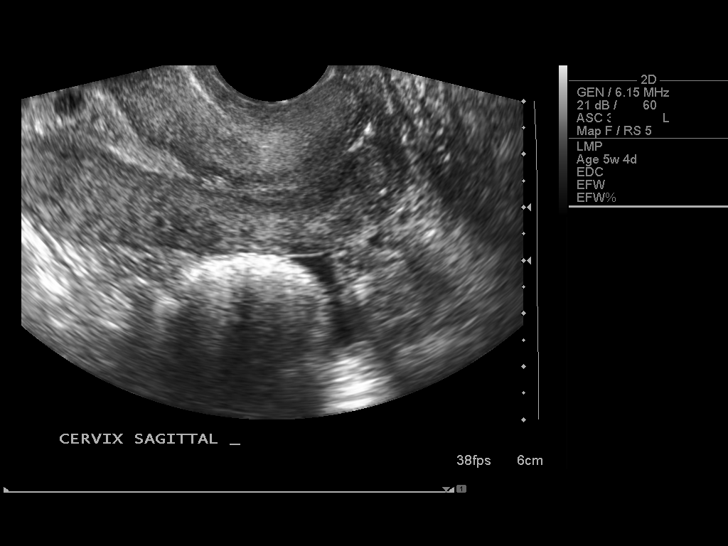
[im 17/50]
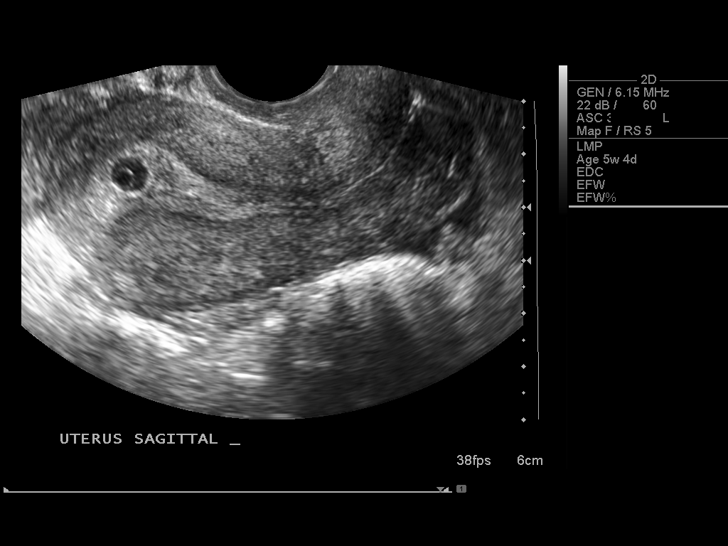
[im 20/50]
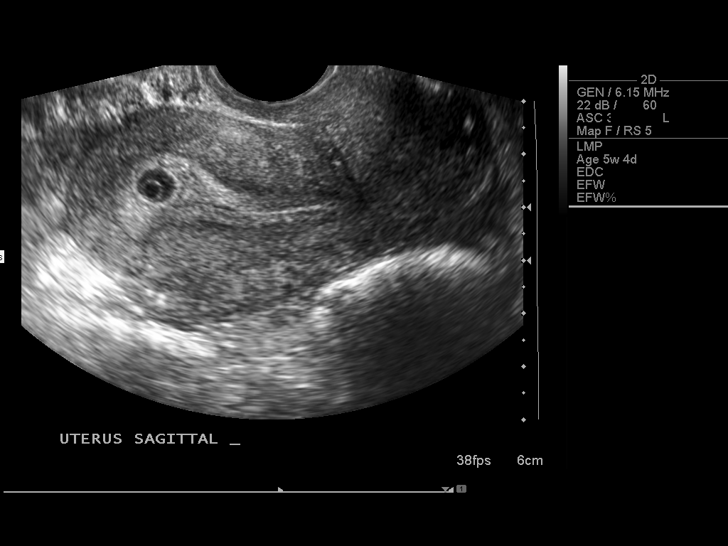
[im 24/50]
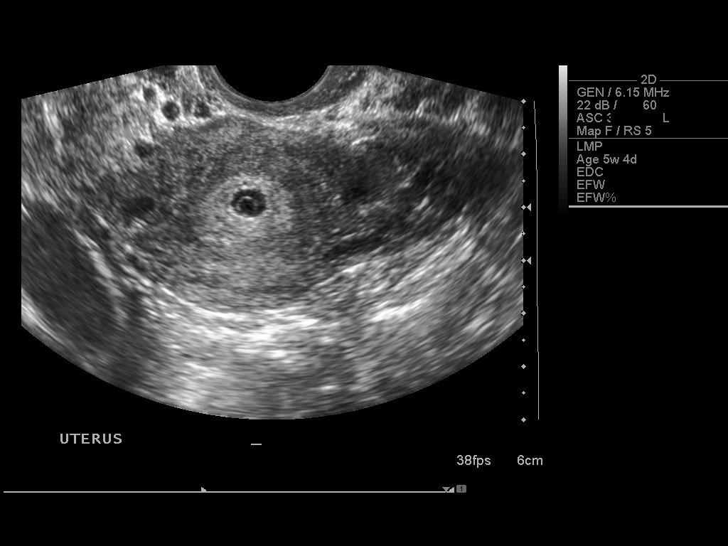
[im 28/50]
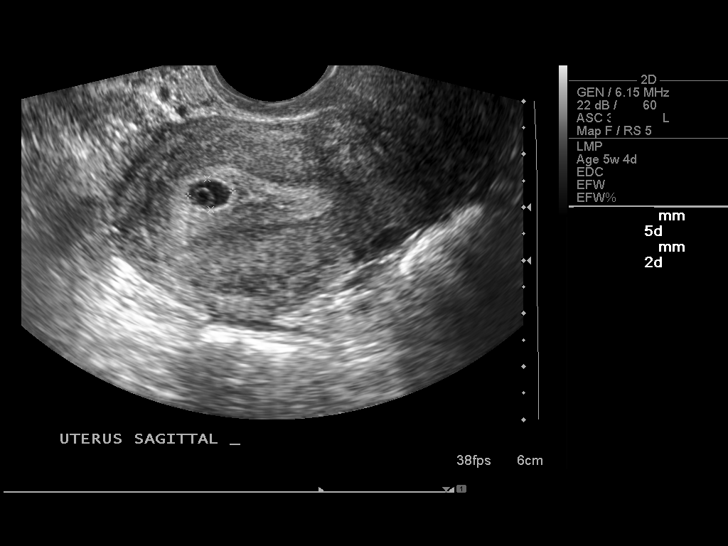
[im 31/50]
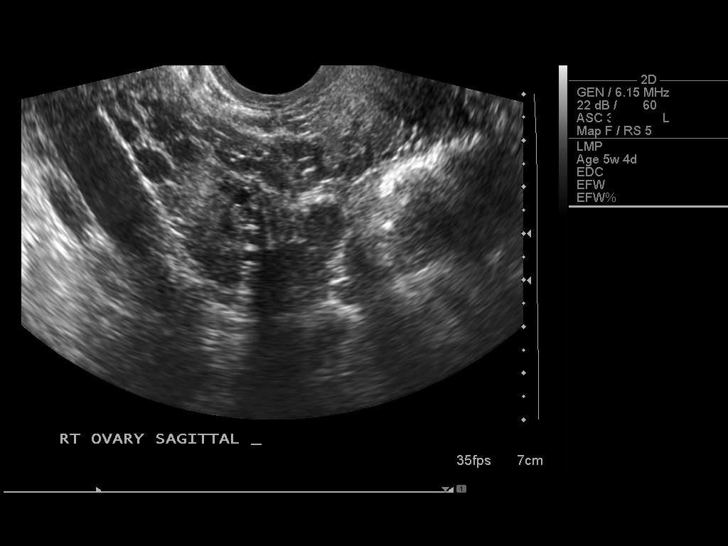
[im 35/50]
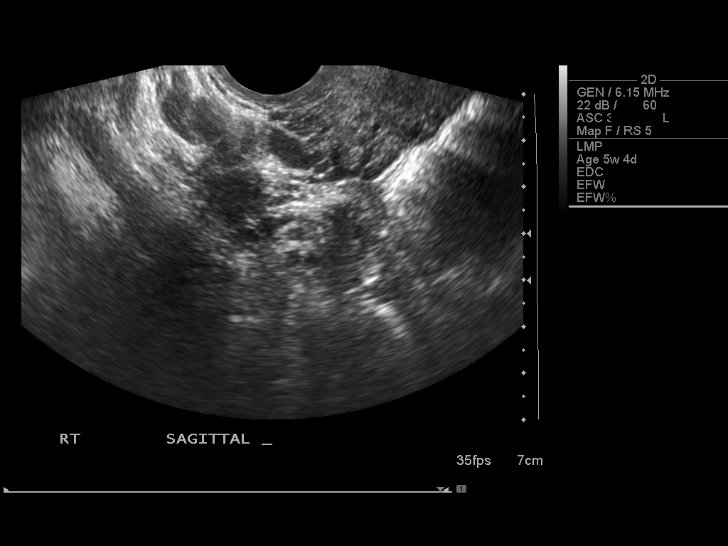
[im 39/50]
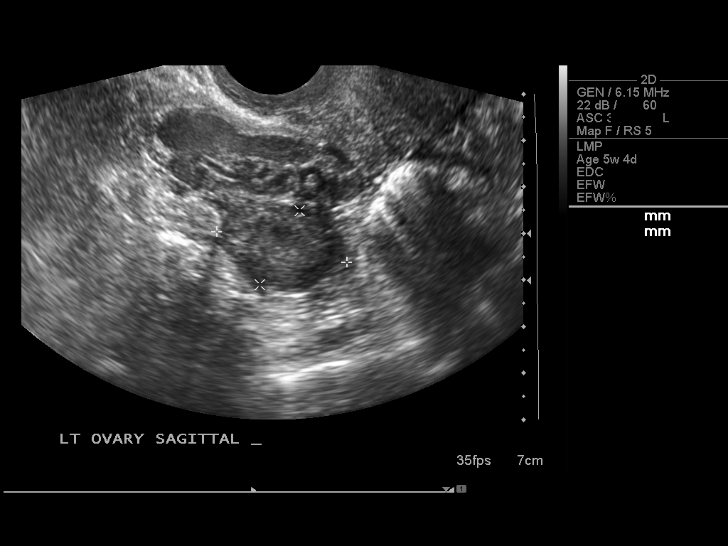
[im 42/50]
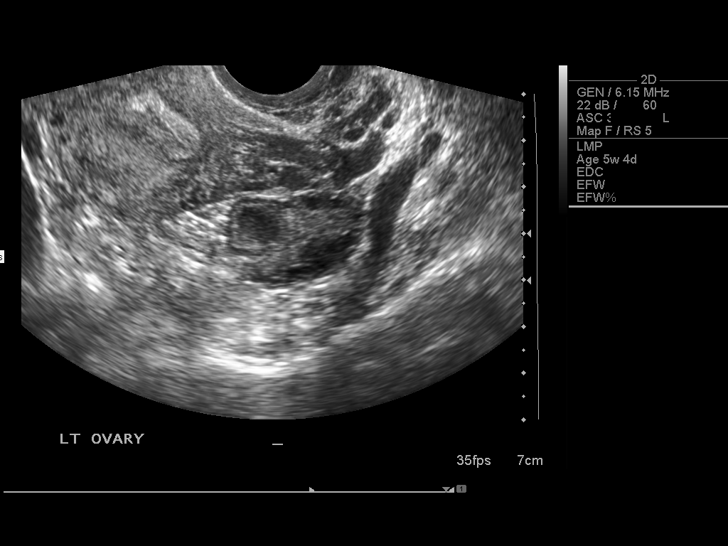
[im 46/50]
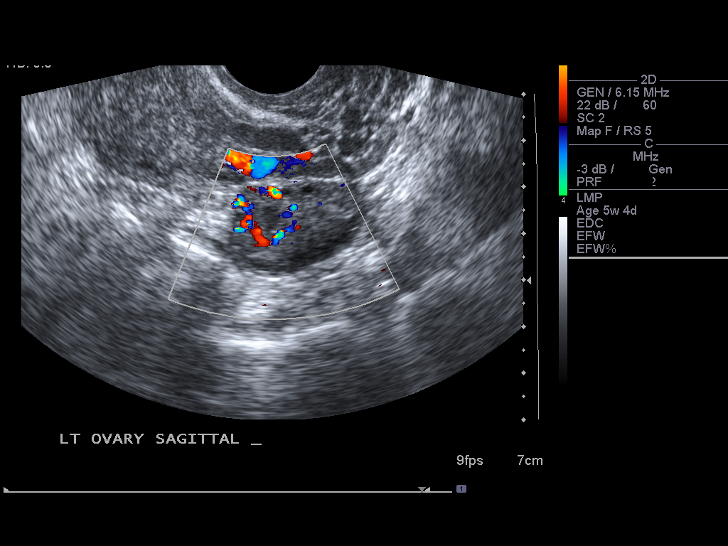
[im 50/50]
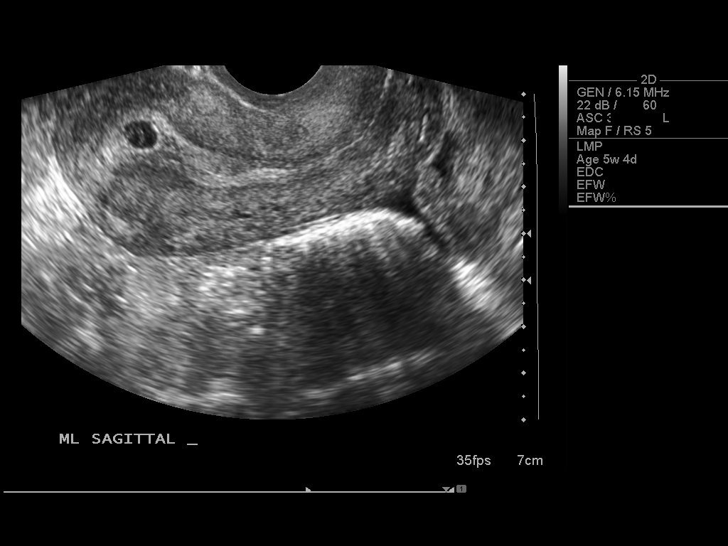

[14 of 28 positions shown; findings below may reference images not displayed]

Intrauterine gestational sac:  Visualized/normal in shape.
Yolk sac: Present
Embryo: None
Cardiac Activity: None
Heart Rate: N/A bpm

MSD: 6.9 mm  5 w 3 d
CRL:   mm   w   d             US EDC: 11/14/2013

Maternal uterus/adnexae:
No subchorionic hemorrhage.
Normal right ovary.
Corpus luteum cyst on the left.
Trace free pelvic fluid.
IMPRESSION: 1.  Intrauterine gestational sac and small yolk sac.  No embryo.
2.  No subchorionic hemorrhage.
3.  Normal ovaries except for a small left sided corpus luteum
cyst.

## 2014-04-22 IMAGING — US US OB TRANSVAGINAL
1 series · 14 of 21 positions shown · non-contrast
Comparison: 03/17/2013

CLINICAL DATA: Vaginal bleeding, recent prior exam demonstrating
intrauterine gestational sac and yolk sac

TRANSVAGINAL OBSTETRIC US
TECHNIQUE: Transvaginal ultrasound was performed for complete
evaluation of the gestation as well as the maternal uterus, adnexal
regions, and pelvic cul-de-sac.

[Series 1: us ob transvaginal · 21 acquisitions, 14 frames shown]
[im 1/21]
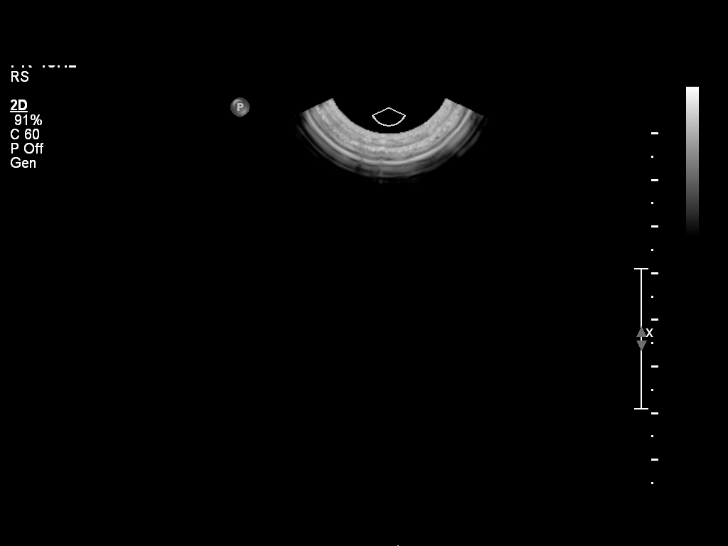
[im 3/21]
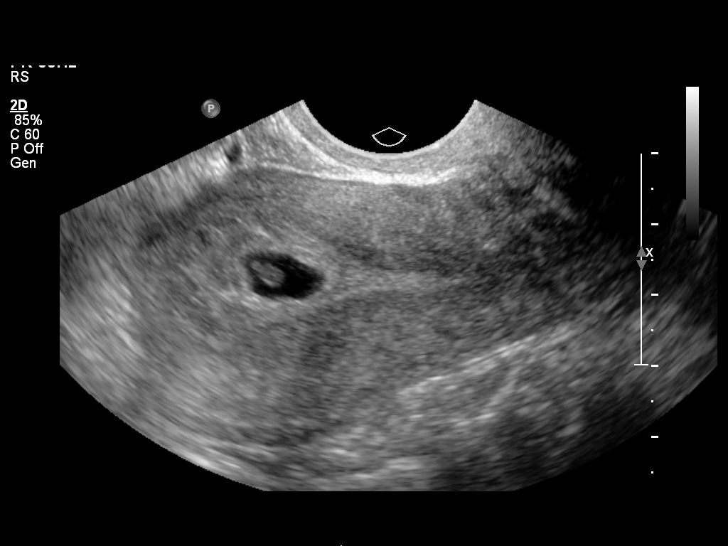
[im 4/21]
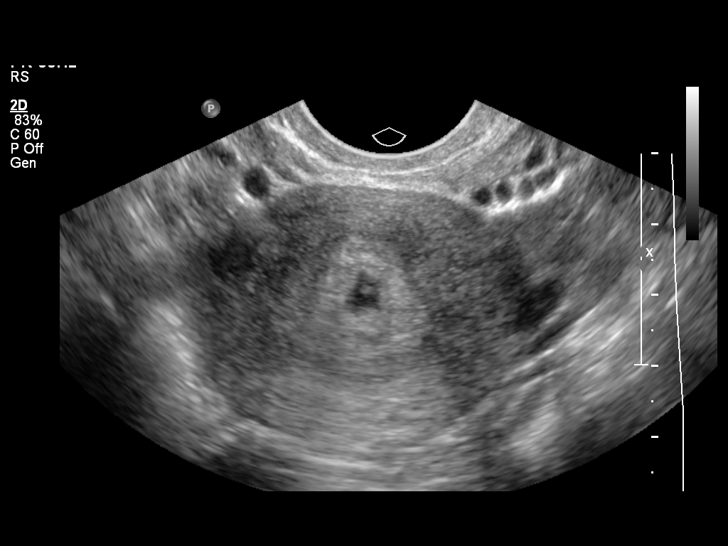
[im 6/21]
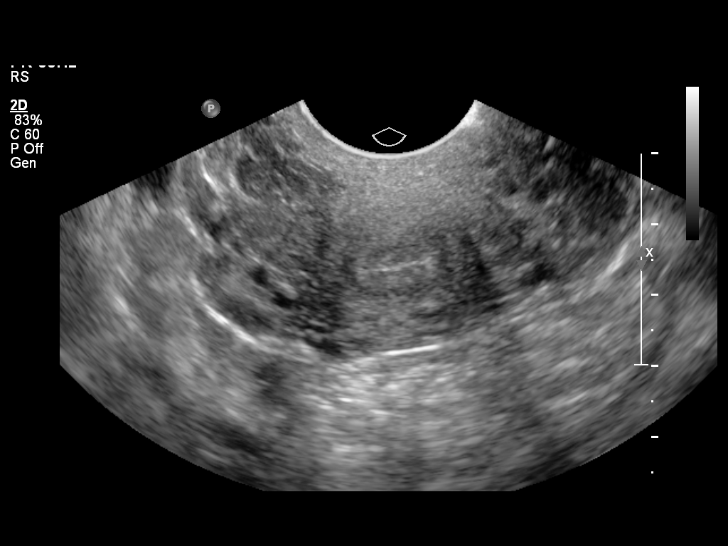
[im 7/21]
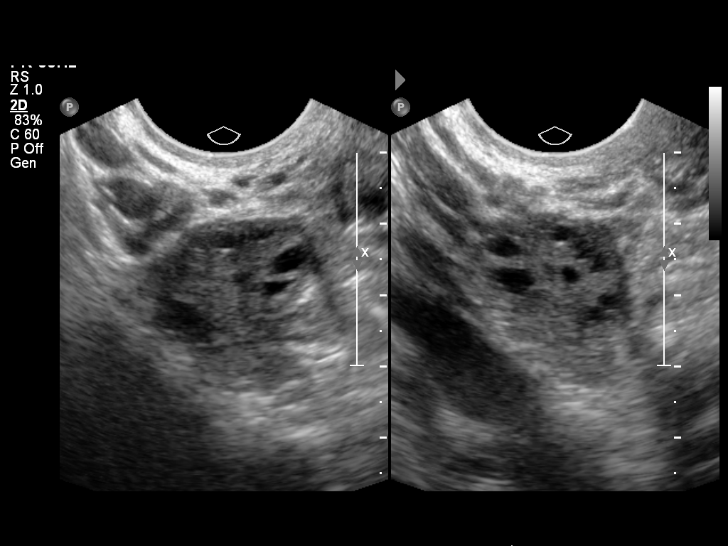
[im 9/21]
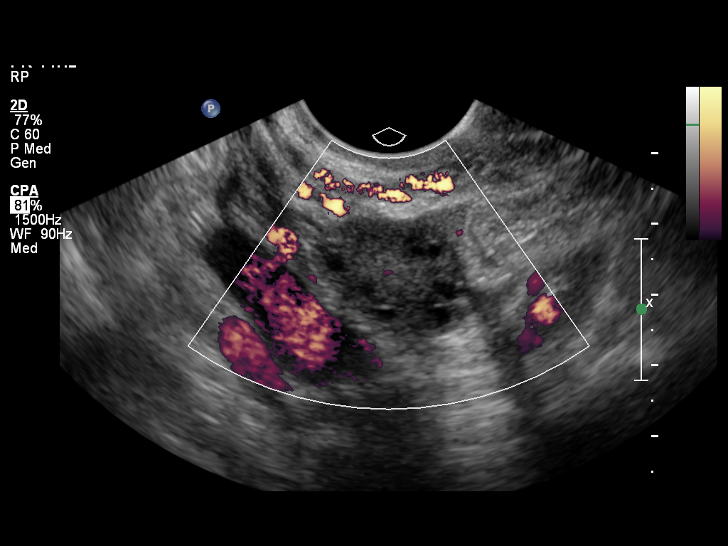
[im 10/21]
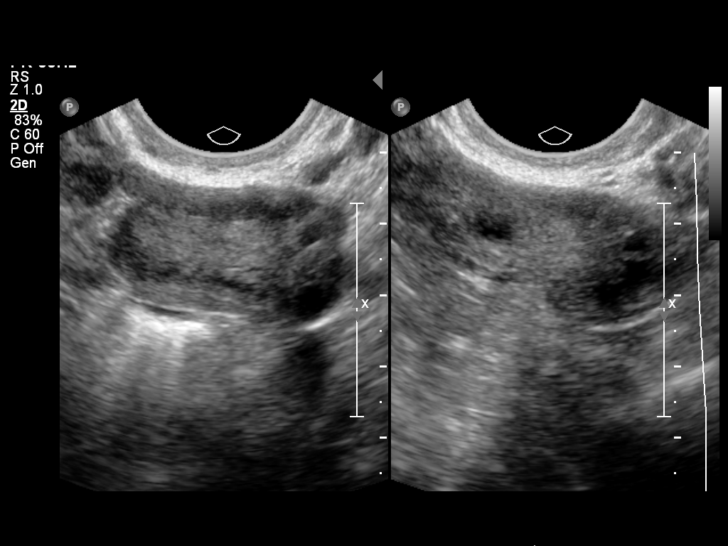
[im 12/21]
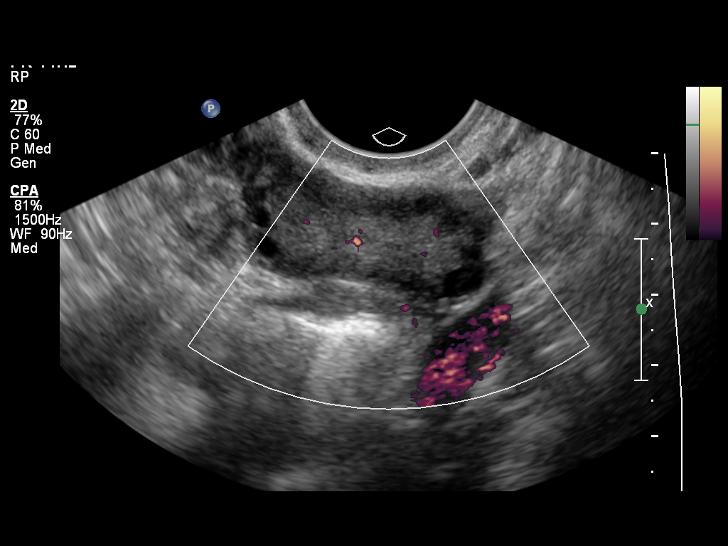
[im 13/21]
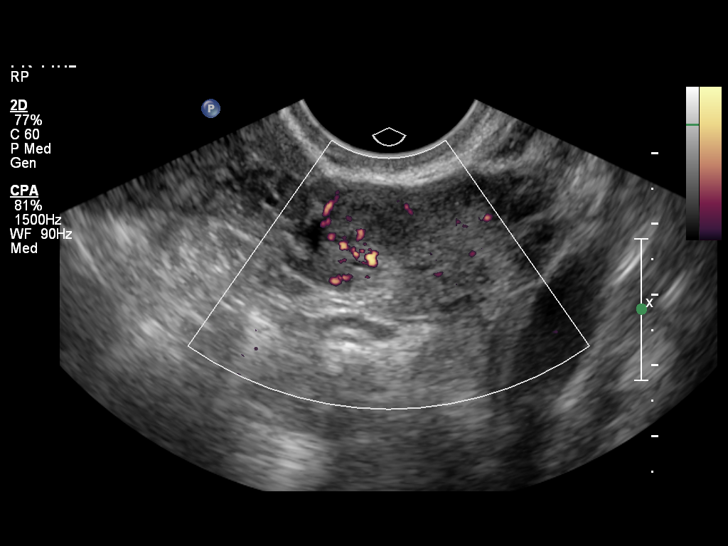
[im 15/21]
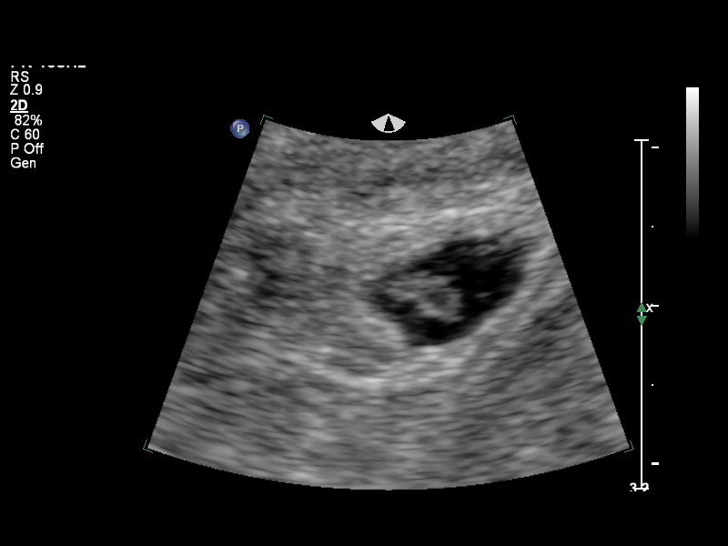
[im 16/21]
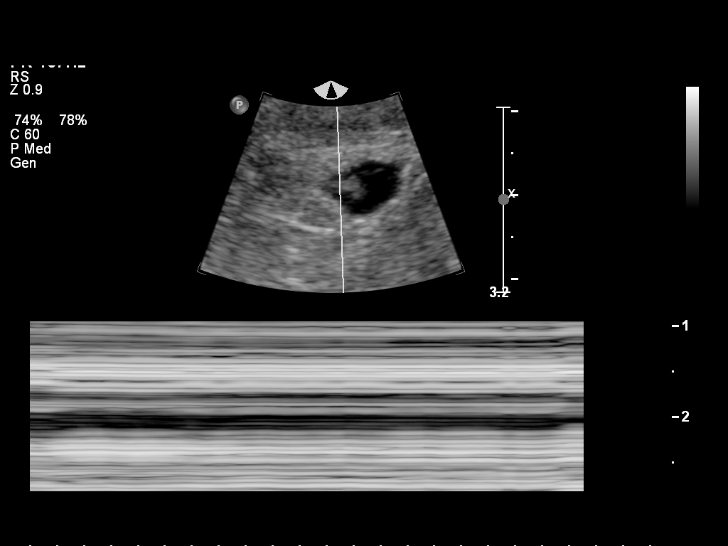
[im 18/21]
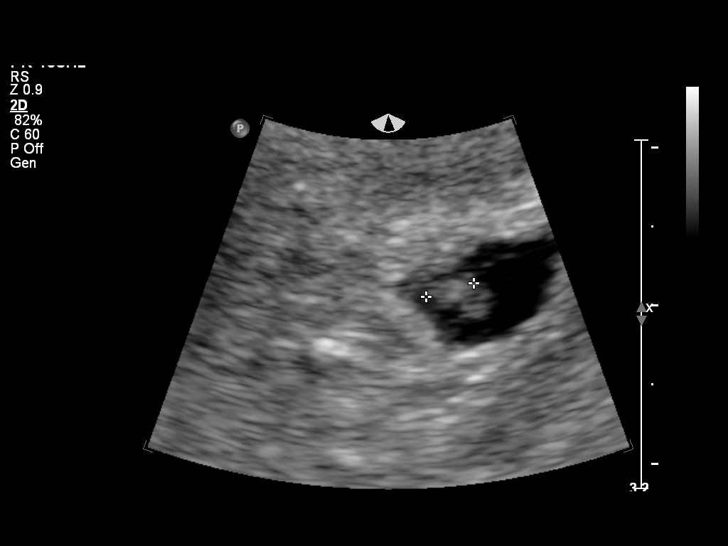
[im 19/21]
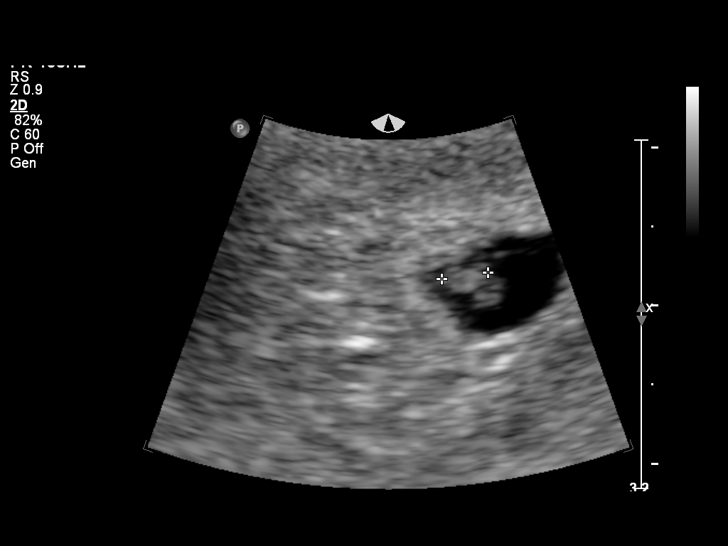
[im 21/21]
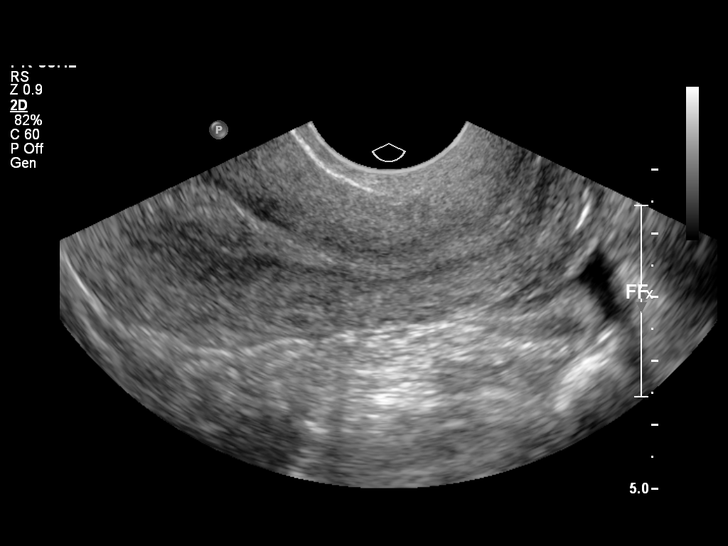

[14 of 21 positions shown; findings below may reference images not displayed]

Intrauterine gestational sac:Visualized, mildly irregular in shape
Yolk sac: Visualized
Embryo: Visualized
Cardiac Activity: Not visualized

CRL: 3mm           6   w  0   d           US EDC: 11/23/13

Maternal uterus/adnexae:
The ovaries normal.  Trace free fluid.
IMPRESSION: Interval development of a fetal pole without cardiac activity yet
identified within an intrauterine gestational sac.  Findings are
suspicious for, but not conclusive for, failed intrauterine
pregnancy.  Follow-up ultrasound is recommended in 7 days for
confirmation.  This recommendation follows SRU consensus
guidelines: Diagnostic Criteria for Nonviable Pregnancy Early in
the First Trimester.  N Engl J Med 8162; [DATE].

## 2014-07-04 ENCOUNTER — Encounter (HOSPITAL_COMMUNITY): Payer: Self-pay | Admitting: *Deleted
